# Patient Record
Sex: Male | Born: 1990 | Race: Black or African American | Hispanic: No | Marital: Single | Smoking: Never smoker
Health system: Southern US, Community
[De-identification: ages and names within clinical notes are randomized; demographics above are authoritative.]

## PROBLEM LIST (undated history)

## (undated) DIAGNOSIS — J302 Other seasonal allergic rhinitis: Secondary | ICD-10-CM

## (undated) DIAGNOSIS — J45909 Unspecified asthma, uncomplicated: Secondary | ICD-10-CM

## (undated) HISTORY — DX: Unspecified asthma, uncomplicated: J45.909

## (undated) HISTORY — DX: Other seasonal allergic rhinitis: J30.2

---

## 2014-02-02 ENCOUNTER — Encounter (HOSPITAL_COMMUNITY): Payer: Self-pay | Admitting: Emergency Medicine

## 2014-02-02 ENCOUNTER — Emergency Department (INDEPENDENT_AMBULATORY_CARE_PROVIDER_SITE_OTHER): Payer: BC Managed Care – PPO

## 2014-02-02 ENCOUNTER — Emergency Department (HOSPITAL_COMMUNITY)
Admission: EM | Admit: 2014-02-02 | Discharge: 2014-02-02 | Disposition: A | Discharge status: Home / Self Care | Payer: BC Managed Care – PPO | Source: Home / Self Care | Attending: Emergency Medicine | Admitting: Emergency Medicine

## 2014-02-02 DIAGNOSIS — S43402A Unspecified sprain of left shoulder joint, initial encounter: Secondary | ICD-10-CM

## 2014-02-02 DIAGNOSIS — S20212A Contusion of left front wall of thorax, initial encounter: Secondary | ICD-10-CM

## 2014-02-02 DIAGNOSIS — IMO0002 Reserved for concepts with insufficient information to code with codable children: Secondary | ICD-10-CM

## 2014-02-02 DIAGNOSIS — S20219A Contusion of unspecified front wall of thorax, initial encounter: Secondary | ICD-10-CM

## 2014-02-02 MED ORDER — DICLOFENAC POTASSIUM 50 MG PO TABS: 50.0000 mg | ORAL_TABLET | Freq: Two times a day (BID) | ORAL | Status: DC | PRN

## 2014-02-02 NOTE — ED Provider Notes (Signed)
CSN: [id number]     Arrival date & time 02/02/14  1623 History   First MD Initiated Contact with Patient 02/02/14 1653     Chief Complaint  Patient presents with  . Shoulder Pain  . Chest Pain   (Consider location/radiation/quality/duration/timing/severity/associated sxs/prior Treatment) HPI Comments: States he works for [employer] and for [employer]. 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 States left shoulder and left lateral chest wall have been sore with palpation and movement since injury. Has used occasional dose of ibuprofen for pain.  PCP: none Denies previous injury or surgery.   The history is provided by the patient.    History reviewed. No pertinent past medical history. History reviewed. No pertinent past surgical history. No family history on file. History  Substance Use Topics  . Smoking status: Never Smoker   . Smokeless tobacco: Not on file  . Alcohol Use: No    Review of Systems  All other systems reviewed and are negative.   Allergies  Review of patient's allergies indicates no known allergies.  Home Medications   Prior to Admission medications   Medication Sig Start Date End Date Taking? Authorizing Provider  diclofenac (CATAFLAM) 50 MG tablet Take 1 tablet (50 mg total) by mouth 2 (two) times daily as needed. For pain 02/02/14   Jess Barters Evanny Ellerbe, PA   BP 138/74  Pulse 66  Temp(Src) 98.3 F (36.8 C) (Oral)  Resp 16  SpO2 100% Physical Exam  Nursing note and vitals reviewed. Constitutional: He is oriented to person, place, and time. He appears well-developed and well-nourished.  HENT:  Head: Normocephalic and atraumatic.  Eyes: Conjunctivae are normal. No scleral icterus.  Cardiovascular: Normal rate, regular rhythm and normal heart sounds.     Pulmonary/Chest: Effort normal and breath sounds normal. No respiratory distress. He has no wheezes. Chest wall is not dull to percussion. He exhibits tenderness. He exhibits no bony tenderness, no laceration, no crepitus, no edema, no deformity, no swelling and no retraction.    Outlined area is area of tenderness.  Abdominal: Soft. Bowel sounds are normal. He exhibits no distension. There is no tenderness.  Musculoskeletal:       Left shoulder: He exhibits tenderness. He exhibits normal range of motion, no bony tenderness, no swelling, no effusion, no crepitus, no deformity, no laceration, normal pulse and normal strength.       Arms: CSM exam of LUE normal. No evidence of shoulder dislocation or biceps tendon rupture.   Neurological: He is alert and oriented to person, place, and time.  Skin: Skin is warm and dry.  Psychiatric: His behavior is normal.    ED Course  Procedures (including critical care time) Labs Review Labs Reviewed - No data to display  Imaging Review Dg Ribs Unilateral W/chest Left  02/02/2014   CLINICAL DATA:  Left-sided chest pain  EXAM: LEFT RIBS AND CHEST - 3+ VIEW  COMPARISON:  None.  FINDINGS: No fracture or other bone lesions are seen involving the ribs. There is no evidence of pneumothorax or pleural effusion. Both lungs are clear. Heart size and mediastinal contours are within normal limits.  IMPRESSION: No acute abnormality noted.   Electronically Signed   By: Alcide Clever M.D.   On: 02/02/2014 18:06  MDM   1. Contusion of ribs, left, initial encounter   2. Sprain of left shoulder joint, initial encounter   Films of left ribs and chest negative for acute injury. Will treat for chest wall contusion and left shoulder sprain with diclofenac and refer to orthopedic if left shoulder pain does not improve over next 1-2 weeks. Patient advised that chest wall contusions can take several weeks to improve.     Jess BartersJennifer Lee Nellis AFBPresson, GeorgiaPA 02/02/14 2025

## 2014-02-02 NOTE — Discharge Instructions (Signed)
Your xrays of your chest and ribs were normal. You can expect your symptoms to improve over the next 1-2 weeks. It is likely that your sprained your left shoulder when you fell. This should also improve over the next 1-2 weeks. Please use medication as prescribed and if symptoms do not improve, please contact the orthopedic specialist (Dr. Wandra Feinstein. Murphy) listed on your discharge paperwork for follow up evaluation   Chest Contusion A chest contusion is a deep bruise on your chest area. Contusions are the result of an injury that caused bleeding under the skin. A chest contusion may involve bruising of the skin, muscles, or ribs. The contusion may turn blue, purple, or yellow. Minor injuries will give you a painless contusion, but more severe contusions may stay painful and swollen for a few weeks. CAUSES  A contusion is usually caused by a blow, trauma, or direct force to an area of the body. SYMPTOMS   Swelling and redness of the injured area.  Discoloration of the injured area.  Tenderness and soreness of the injured area.  Pain. DIAGNOSIS  The diagnosis can be made by taking a history and performing a physical exam. An X-ray, CT scan, or MRI may be needed to determine if there were any associated injuries, such as broken bones (fractures) or internal injuries. TREATMENT  Often, the best treatment for a chest contusion is resting, icing, and applying cold compresses to the injured area. Deep breathing exercises may be recommended to reduce the risk of pneumonia. Over-the-counter medicines may also be recommended for pain control. HOME CARE INSTRUCTIONS   Put ice on the injured area.  Put ice in a plastic bag.  Place a towel between your skin and the bag.  Leave the ice on for 15-20 minutes, 03-04 times a day.  Only take over-the-counter or prescription medicines as directed by your caregiver. Your caregiver may recommend avoiding anti-inflammatory medicines (aspirin, ibuprofen, and naproxen)  for 48 hours because these medicines may increase bruising.  Rest the injured area.  Perform deep-breathing exercises as directed by your caregiver.  Stop smoking if you smoke.  Do not lift objects over 5 pounds (2.3 kg) for 3 days or longer if recommended by your caregiver. SEEK IMMEDIATE MEDICAL CARE IF:   You have increased bruising or swelling.  You have pain that is getting worse.  You have difficulty breathing.  You have dizziness, weakness, or fainting.  You have blood in your urine or stool.  You cough up or vomit blood.  Your swelling or pain is not relieved with medicines. MAKE SURE YOU:   Understand these instructions.  Will watch your condition.  Will get help right away if you are not doing well or get worse. Document Released: 04/03/2001 Document Revised: 04/02/2012 Document Reviewed: 12/31/2011 [facility] Patient Information 2015 [place], [place]. This information is not intended to replace advice given to you by your health care provider. Make sure you discuss any questions you have with your health care provider.  Shoulder Sprain A shoulder sprain is the result of damage to the tough, fiber-like tissues (ligaments) that help hold your shoulder in place. The ligaments may be stretched or torn. Besides the main shoulder joint (the ball and socket), there are several smaller joints that connect the bones in this area. A sprain usually involves one of those joints. Most often it is the acromioclavicular (or AC) joint. That is the joint that connects the collarbone (clavicle) and the shoulder blade (scapula) at the top point of  the shoulder blade (acromion). A shoulder sprain is a mild form of what is called a shoulder separation. Recovering from a shoulder sprain may take some time. For some, pain lingers for several months. Most people recover without long term problems. CAUSES   A shoulder sprain is usually caused by some kind of trauma. This might be:  Falling on  an outstretched arm.  Being hit hard on the shoulder.  Twisting the arm.  Shoulder sprains are more likely to occur in people who:  Play sports.  Have balance or coordination problems. SYMPTOMS   Pain when you move your shoulder.  Limited ability to move the shoulder.  Swelling and tenderness on top of the shoulder.  Redness or warmth in the shoulder.  Bruising.  A change in the shape of the shoulder. DIAGNOSIS  Your healthcare provider may:  Ask about your symptoms.  Ask about recent activity that might have caused those symptoms.  Examine your shoulder. You may be asked to do simple exercises to test movement. The other shoulder will be examined for comparison.  Order some tests that provide a look inside the body. They can show the extent of the injury. The tests could include:  X-rays.  CT (computed tomography) scan.  MRI (magnetic resonance imaging) scan. RISKS AND COMPLICATIONS  Loss of full shoulder motion.  Ongoing shoulder pain. TREATMENT  How long it takes to recover from a shoulder sprain depends on how severe it was. Treatment options may include:  Rest. You should not use the arm or shoulder until it heals.  Ice. For 2 or 3 days after the injury, put an ice pack on the shoulder up to 4 times a day. It should stay on for 15 to 20 minutes each time. Wrap the ice in a towel so it does not touch your skin.  Over-the-counter medicine to relieve pain.  A sling or brace. This will keep the arm still while the shoulder is healing.  Physical therapy or rehabilitation exercises. These will help you regain strength and motion. Ask your healthcare provider when it is OK to begin these exercises.  Surgery. The need for surgery is rare with a sprained shoulder, but some people may need surgery to keep the joint in place and reduce pain. HOME CARE INSTRUCTIONS   Ask your healthcare provider about what you should and should not do while your shoulder  heals.  Make sure you know how to apply ice to the correct area of your shoulder.  Talk with your healthcare provider about which medications should be used for pain and swelling.  If rehabilitation therapy will be needed, ask your healthcare provider to refer you to a therapist. If it is not recommended, then ask about at-home exercises. Find out when exercise should begin. SEEK MEDICAL CARE IF:  Your pain, swelling, or redness at the joint increases. SEEK IMMEDIATE MEDICAL CARE IF:   You have a fever.  You cannot move your arm or shoulder. Document Released: 11/25/2008 Document Revised: 10/01/2011 Document Reviewed: 11/25/2008 [facility] Patient Information 2015 [place], [place]. This information is not intended to replace advice given to you by your health care provider. Make sure you discuss any questions you have with your health care provider.

## 2014-02-02 NOTE — ED Notes (Addendum)
Pain in left shoulder that patient credits to an old football injury. Recently started playing football again and thinks he aggravated an old injury. Patient also reports left rib pain, tender to touch.  Patient reports feeling on a ladder.  This was approx 1 1/2 weeks ago.  Patient landed on the side of the ladder.

## 2014-02-03 NOTE — ED Provider Notes (Signed)
Medical screening examination/treatment/procedure(s) were performed by non-physician practitioner and as supervising physician I was immediately available for consultation/collaboration.  Hien Cunliffe, M.D.  Decorey Wahlert C Laelia Angelo, MD 02/03/14 1643 

## 2014-06-21 ENCOUNTER — Emergency Department (HOSPITAL_COMMUNITY): Payer: BC Managed Care – PPO

## 2014-06-21 ENCOUNTER — Encounter (HOSPITAL_COMMUNITY): Payer: Self-pay | Admitting: Emergency Medicine

## 2014-06-21 ENCOUNTER — Emergency Department (HOSPITAL_COMMUNITY)
Admission: EM | Admit: 2014-06-21 | Discharge: 2014-06-21 | Disposition: A | Discharge status: Home / Self Care | Payer: BC Managed Care – PPO | Attending: Emergency Medicine | Admitting: Emergency Medicine

## 2014-06-21 DIAGNOSIS — S39012A Strain of muscle, fascia and tendon of lower back, initial encounter: Secondary | ICD-10-CM

## 2014-06-21 DIAGNOSIS — S3992XA Unspecified injury of lower back, initial encounter: Secondary | ICD-10-CM | POA: Insufficient documentation

## 2014-06-21 DIAGNOSIS — Y998 Other external cause status: Secondary | ICD-10-CM | POA: Insufficient documentation

## 2014-06-21 DIAGNOSIS — Y92410 Unspecified street and highway as the place of occurrence of the external cause: Secondary | ICD-10-CM | POA: Insufficient documentation

## 2014-06-21 DIAGNOSIS — Y9389 Activity, other specified: Secondary | ICD-10-CM | POA: Insufficient documentation

## 2014-06-21 MED ORDER — HYDROCODONE-ACETAMINOPHEN 5-325 MG PO TABS: 1.0000 | ORAL_TABLET | Freq: Four times a day (QID) | ORAL | Status: DC | PRN

## 2014-06-21 NOTE — ED Notes (Signed)
Pt monitored by pulse ox, bp cuff, and 5-lead. 
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
## 2016-03-16 ENCOUNTER — Ambulatory Visit: Payer: Self-pay

## 2016-03-16 ENCOUNTER — Other Ambulatory Visit: Payer: Self-pay | Admitting: Occupational Medicine

## 2016-03-16 DIAGNOSIS — M79642 Pain in left hand: Secondary | ICD-10-CM

## 2016-03-16 DIAGNOSIS — M79641 Pain in right hand: Secondary | ICD-10-CM

## 2016-03-23 ENCOUNTER — Ambulatory Visit: Payer: Self-pay | Admitting: Adult Health

## 2016-03-23 DIAGNOSIS — Z0289 Encounter for other administrative examinations: Secondary | ICD-10-CM

## 2016-04-05 ENCOUNTER — Encounter: Payer: Self-pay | Admitting: Adult Health

## 2016-04-05 ENCOUNTER — Ambulatory Visit (INDEPENDENT_AMBULATORY_CARE_PROVIDER_SITE_OTHER): Payer: BLUE CROSS/BLUE SHIELD | Admitting: Adult Health

## 2016-04-05 VITALS — BP 110/70 | Temp 98.2°F | Ht 71.0 in | Wt 184.4 lb

## 2016-04-05 DIAGNOSIS — Z0000 Encounter for general adult medical examination without abnormal findings: Secondary | ICD-10-CM

## 2016-04-05 DIAGNOSIS — Z711 Person with feared health complaint in whom no diagnosis is made: Secondary | ICD-10-CM

## 2016-04-05 NOTE — Patient Instructions (Signed)
It was great meeting you today!  Please follow up one morning for your labs. I will follow up with you after I get the results back. Do not eat or drink anything besides water and black coffee the night before.   Follow up as needed  Health Maintenance, Male A healthy lifestyle and preventative care can promote health and wellness.  Maintain regular health, dental, and eye exams.  Eat a healthy diet. Foods like vegetables, fruits, whole grains, low-fat dairy products, and lean protein foods contain the nutrients you need and are low in calories. Decrease your intake of foods high in solid fats, added sugars, and salt. Get information about a proper diet from your health care provider, if necessary.  Regular physical exercise is one of the most important things you can do for your health. Most adults should get at least 150 minutes of moderate-intensity exercise (any activity that increases your heart rate and causes you to sweat) each week. In addition, most adults need muscle-strengthening exercises on 2 or more days a week.   Maintain a healthy weight. The body mass index (BMI) is a screening tool to identify possible weight problems. It provides an estimate of body fat based on height and weight. Your health care provider can find your BMI and can help you achieve or maintain a healthy weight. For males 20 years and older:  A BMI below 18.5 is considered underweight.  A BMI of 18.5 to 24.9 is normal.  A BMI of 25 to 29.9 is considered overweight.  A BMI of 30 and above is considered obese.  Maintain normal blood lipids and cholesterol by exercising and minimizing your intake of saturated fat. Eat a balanced diet with plenty of fruits and vegetables. Blood tests for lipids and cholesterol should begin at age 23 and be repeated every 5 years. If your lipid or cholesterol levels are high, you are over age 23, or you are at high risk for heart disease, you may need your cholesterol levels  checked more frequently.Ongoing high lipid and cholesterol levels should be treated with medicines if diet and exercise are not working.  If you smoke, find out from your health care provider how to quit. If you do not use tobacco, do not start.  Lung cancer screening is recommended for adults aged 55-80 years who are at high risk for developing lung cancer because of a history of smoking. A yearly low-dose CT scan of the lungs is recommended for people who have at least a 30-pack-year history of smoking and are current smokers or have quit within the past 15 years. A pack year of smoking is smoking an average of 1 pack of cigarettes a day for 1 year (for example, a 30-pack-year history of smoking could mean smoking 1 pack a day for 30 years or 2 packs a day for 15 years). Yearly screening should continue until the smoker has stopped smoking for at least 15 years. Yearly screening should be stopped for people who develop a health problem that would prevent them from having lung cancer treatment.  If you choose to drink alcohol, do not have more than 2 drinks per day. One drink is considered to be 12 oz (360 mL) of beer, 5 oz (150 mL) of wine, or 1.5 oz (45 mL) of liquor.  Avoid the use of street drugs. Do not share needles with anyone. Ask for help if you need support or instructions about stopping the use of drugs.  High blood pressure  causes heart disease and increases the risk of stroke. High blood pressure is more likely to develop in:  People who have blood pressure in the end of the normal range (100-139/85-89 mm Hg).  People who are overweight or obese.  People who are African American.  If you are 66-69 years of age, have your blood pressure checked every 3-5 years. If you are 2 years of age or older, have your blood pressure checked every year. You should have your blood pressure measured twice--once when you are at a hospital or clinic, and once when you are not at a hospital or clinic.  Record the average of the two measurements. To check your blood pressure when you are not at a hospital or clinic, you can use:  An automated blood pressure machine at a pharmacy.  A home blood pressure monitor.  If you are 64-67 years old, ask your health care provider if you should take aspirin to prevent heart disease.  Diabetes screening involves taking a blood sample to check your fasting blood sugar level. This should be done once every 3 years after age 38 if you are at a normal weight and without risk factors for diabetes. Testing should be considered at a younger age or be carried out more frequently if you are overweight and have at least 1 risk factor for diabetes.  Colorectal cancer can be detected and often prevented. Most routine colorectal cancer screening begins at the age of 44 and continues through age 67. However, your health care provider may recommend screening at an earlier age if you have risk factors for colon cancer. On a yearly basis, your health care provider may provide home test kits to check for hidden blood in the stool. A small camera at the end of a tube may be used to directly examine the colon (sigmoidoscopy or colonoscopy) to detect the earliest forms of colorectal cancer. Talk to your health care provider about this at age 46 when routine screening begins. A direct exam of the colon should be repeated every 5-10 years through age 59, unless early forms of precancerous polyps or small growths are found.  People who are at an increased risk for hepatitis B should be screened for this virus. You are considered at high risk for hepatitis B if:  You were born in a country where hepatitis B occurs often. Talk with your health care provider about which countries are considered high risk.  Your parents were born in a high-risk country and you have not received a shot to protect against hepatitis B (hepatitis B vaccine).  You have HIV or AIDS.  You use needles to  inject street drugs.  You live with, or have sex with, someone who has hepatitis B.  You are a man who has sex with other men (MSM).  You get hemodialysis treatment.  You take certain medicines for conditions like cancer, organ transplantation, and autoimmune conditions.  Hepatitis C blood testing is recommended for all people born from [birth year] through 1963 and any individual with known risk factors for hepatitis C.  Healthy men should no longer receive prostate-specific antigen (PSA) blood tests as part of routine cancer screening. Talk to your health care provider about prostate cancer screening.  Testicular cancer screening is not recommended for adolescents or adult males who have no symptoms. Screening includes self-exam, a health care provider exam, and other screening tests. Consult with your health care provider about any symptoms you have or any concerns you have  about testicular cancer.  Practice safe sex. Use condoms and avoid high-risk sexual practices to reduce the spread of sexually transmitted infections (STIs).  You should be screened for STIs, including gonorrhea and chlamydia if:  You are sexually active and are younger than 24 years.  You are older than 24 years, and your health care provider tells you that you are at risk for this type of infection.  Your sexual activity has changed since you were last screened, and you are at an increased risk for chlamydia or gonorrhea. Ask your health care provider if you are at risk.  If you are at risk of being infected with HIV, it is recommended that you take a prescription medicine daily to prevent HIV infection. This is called pre-exposure prophylaxis (PrEP). You are considered at risk if:  You are a man who has sex with other men (MSM).  You are a heterosexual man who is sexually active with multiple partners.  You take drugs by injection.  You are sexually active with a partner who has HIV.  Talk with your health care  provider about whether you are at high risk of being infected with HIV. If you choose to begin PrEP, you should first be tested for HIV. You should then be tested every 3 months for as long as you are taking PrEP.  Use sunscreen. Apply sunscreen liberally and repeatedly throughout the day. You should seek shade when your shadow is shorter than you. Protect yourself by wearing long sleeves, pants, a wide-brimmed hat, and sunglasses year round whenever you are outdoors.  Tell your health care provider of new moles or changes in moles, especially if there is a change in shape or color. Also, tell your health care provider if a mole is larger than the size of a pencil eraser.  A one-time screening for abdominal aortic aneurysm (AAA) and surgical repair of large AAAs by ultrasound is recommended for men aged 67-75 years who are current or former smokers.  Stay current with your vaccines (immunizations).   This information is not intended to replace advice given to you by your health care provider. Make sure you discuss any questions you have with your health care provider.   Document Released: 01/05/2008 Document Revised: 07/30/2014 Document Reviewed: 12/04/2010 Elsevier Interactive Patient Education [employer].

## 2016-04-05 NOTE — Progress Notes (Signed)
Patient presents to clinic today to establish care. He is a pleasant 25 year old male who  has a past medical history of Asthma and Seasonal allergies.   Acute Concerns: Complete physical.  Chronic Issues: Asthma  - Controlled with current medications  Health Maintenance: Dental -- Routine Care  Vision -- Does not do routine care Immunizations -- UTD   Diet: He works out at home and works at [employer]  Exercise: He does not follow a diet. He tries to cook most of the week. Eats out 2-3 times a week    Past Medical History:  Diagnosis Date  . Asthma   . Seasonal allergies     No past surgical history on file.  No current outpatient prescriptions on file prior to visit.   No current facility-administered medications on file prior to visit.     No Known Allergies  Family History  Problem Relation Age of Onset  . Hypertension Paternal Grandmother     Social History   Social History  . Marital status: Single    Spouse name: N/A  . Number of children: N/A  . Years of education: N/A   Occupational History  . Not on file.   Social History Main Topics  . Smoking status: Never Smoker  . Smokeless tobacco: Never Used  . Alcohol use Yes     Comment: "Socially"  . Drug use:     Frequency: 2.0 times per week    Types: Marijuana  . Sexual activity: Yes   Other Topics Concern  . Not on file   Social History Narrative   He works at [employer]   He is going to start college next semester for business   Not married   No kids      He likes to write music and poetry. He likes to be with friends    Review of Systems  Constitutional: Negative.   Respiratory: Negative.   Cardiovascular: Negative.   Gastrointestinal: Negative.   Genitourinary: Negative.   Neurological: Negative.   All other systems reviewed and are negative.   BP 110/70   Temp 98.2 F (36.8 C) (Oral)   Ht 5\' 11"  (1.803 m)   Wt 184 lb 6.4 oz (83.6 kg)   BMI 25.72 kg/m   Physical Exam    Constitutional: He is oriented to person, place, and time and well-developed, well-nourished, and in no distress. No distress.  HENT:  Head: Normocephalic and atraumatic.  Right Ear: External ear normal.  Left Ear: External ear normal.  Nose: Nose normal.  Mouth/Throat: Oropharynx is clear and moist.  Neck: Normal range of motion. Neck supple. No thyromegaly present.  Cardiovascular: Normal rate, regular rhythm, normal heart sounds and intact distal pulses.  Exam reveals no gallop and no friction rub.   No murmur heard. Pulmonary/Chest: Effort normal and breath sounds normal. No respiratory distress. He has no wheezes. He has no rales. He exhibits no tenderness.  Lymphadenopathy:    He has no cervical adenopathy.  Neurological: He is alert and oriented to person, place, and time. Gait normal. GCS score is 15.  Skin: Skin is warm and dry. No rash noted. He is not diaphoretic. No erythema. No pallor.  Psychiatric: Mood, memory, affect and judgment normal.  Nursing note and vitals reviewed.   Assessment/Plan:  1. Routine general medical examination at a health care facility - POCT Urinalysis Dipstick (Automated); Future - Basic metabolic panel; Future - CBC with Differential/Platelet; Future - Hepatic function  panel; Future - Lipid panel; Future - TSH; Future - Acute Hep Panel & Hep B Surface Ab; Future - HIV antibody; Future - HSV(herpes smplx)abs-1+2(IgG+IgM)-bld; Future - RPR; Future - Urine cytology ancillary only; Future - Follow up in one year or sooner if needed  2. Concern about STD in male without diagnosis - Acute Hep Panel & Hep B Surface Ab; Future - HIV antibody; Future - HSV(herpes smplx)abs-1+2(IgG+IgM)-bld; Future - RPR; Future - Urine cytology ancillary only; Future  Shirline Frees, NP

## 2016-04-18 ENCOUNTER — Other Ambulatory Visit (INDEPENDENT_AMBULATORY_CARE_PROVIDER_SITE_OTHER): Payer: BLUE CROSS/BLUE SHIELD

## 2016-04-18 ENCOUNTER — Other Ambulatory Visit (HOSPITAL_COMMUNITY)
Admission: RE | Admit: 2016-04-18 | Discharge: 2016-04-18 | Disposition: A | Discharge status: Home / Self Care | Payer: BLUE CROSS/BLUE SHIELD | Source: Ambulatory Visit | Attending: Adult Health | Admitting: Adult Health

## 2016-04-18 DIAGNOSIS — Z0000 Encounter for general adult medical examination without abnormal findings: Secondary | ICD-10-CM | POA: Diagnosis not present

## 2016-04-18 DIAGNOSIS — Z711 Person with feared health complaint in whom no diagnosis is made: Secondary | ICD-10-CM

## 2016-04-18 DIAGNOSIS — Z113 Encounter for screening for infections with a predominantly sexual mode of transmission: Secondary | ICD-10-CM | POA: Diagnosis present

## 2016-04-18 LAB — CBC WITH DIFFERENTIAL/PLATELET
Basophils Absolute: 0 10*3/uL (ref 0.0–0.1)
Basophils Relative: 0.5 % (ref 0.0–3.0)
EOS PCT: 3.3 % (ref 0.0–5.0)
Eosinophils Absolute: 0.1 10*3/uL (ref 0.0–0.7)
HCT: 38.6 % — ABNORMAL LOW (ref 39.0–52.0)
HEMOGLOBIN: 12.8 g/dL — AB (ref 13.0–17.0)
Lymphocytes Relative: 47.8 % — ABNORMAL HIGH (ref 12.0–46.0)
Lymphs Abs: 1.9 10*3/uL (ref 0.7–4.0)
MCHC: 33.1 g/dL (ref 30.0–36.0)
MCV: 84.6 fl (ref 78.0–100.0)
MONOS PCT: 10.7 % (ref 3.0–12.0)
Monocytes Absolute: 0.4 10*3/uL (ref 0.1–1.0)
Neutro Abs: 1.5 10*3/uL (ref 1.4–7.7)
Neutrophils Relative %: 37.7 % — ABNORMAL LOW (ref 43.0–77.0)
Platelets: 200 10*3/uL (ref 150.0–400.0)
RBC: 4.56 Mil/uL (ref 4.22–5.81)
RDW: 13.8 % (ref 11.5–15.5)
WBC: 4 10*3/uL (ref 4.0–10.5)

## 2016-04-18 LAB — HEPATIC FUNCTION PANEL
ALBUMIN: 3.6 g/dL (ref 3.5–5.2)
ALT: 6 U/L (ref 0–53)
AST: 13 U/L (ref 0–37)
Alkaline Phosphatase: 50 U/L (ref 39–117)
Bilirubin, Direct: 0.1 mg/dL (ref 0.0–0.3)
Total Bilirubin: 0.5 mg/dL (ref 0.2–1.2)
Total Protein: 5.9 g/dL — ABNORMAL LOW (ref 6.0–8.3)

## 2016-04-18 LAB — POC URINALSYSI DIPSTICK (AUTOMATED)
Bilirubin, UA: NEGATIVE
Blood, UA: NEGATIVE
Glucose, UA: NEGATIVE
KETONES UA: NEGATIVE
Leukocytes, UA: NEGATIVE
NITRITE UA: NEGATIVE
PROTEIN UA: NEGATIVE
Spec Grav, UA: 1.02
Urobilinogen, UA: 1
pH, UA: 6

## 2016-04-18 LAB — TSH: TSH: 1.39 u[IU]/mL (ref 0.35–4.50)

## 2016-04-18 LAB — LIPID PANEL
CHOLESTEROL: 134 mg/dL (ref 0–200)
HDL: 45.4 mg/dL (ref >39.00)
LDL Cholesterol: 68 mg/dL (ref 0–99)
NonHDL: 88.35
Total CHOL/HDL Ratio: 3
Triglycerides: 101 mg/dL (ref 0.0–149.0)
VLDL: 20.2 mg/dL (ref 0.0–40.0)

## 2016-04-18 LAB — BASIC METABOLIC PANEL
BUN: 12 mg/dL (ref 6–23)
CO2: 28 mEq/L (ref 19–32)
Calcium: 8.7 mg/dL (ref 8.4–10.5)
Chloride: 105 mEq/L (ref 96–112)
Creatinine, Ser: 1.12 mg/dL (ref 0.40–1.50)
GFR: 104.3 mL/min (ref >60.00)
GLUCOSE: 95 mg/dL (ref 70–99)
Potassium: 3.9 mEq/L (ref 3.5–5.1)
SODIUM: 141 meq/L (ref 135–145)

## 2016-04-19 LAB — ACUTE HEP PANEL AND HEP B SURFACE AB
HCV Ab: NEGATIVE
HEP A IGM: NONREACTIVE
Hep B C IgM: NONREACTIVE
Hep B S Ab: POSITIVE — AB
Hepatitis B Surface Ag: NEGATIVE

## 2016-04-19 LAB — URINE CYTOLOGY ANCILLARY ONLY
Chlamydia: NEGATIVE
Neisseria Gonorrhea: NEGATIVE
Trichomonas: NEGATIVE

## 2016-04-19 LAB — RPR

## 2016-04-19 LAB — HIV ANTIBODY (ROUTINE TESTING W REFLEX): HIV: NONREACTIVE

## 2016-04-23 LAB — URINE CYTOLOGY ANCILLARY ONLY: Candida vaginitis: NEGATIVE

## 2016-04-26 LAB — HSV(HERPES SMPLX)ABS-I+II(IGG+IGM)-BLD: Herpes Simplex Vrs I&II-IgM Ab (EIA): 0.16 INDEX

## 2016-05-02 ENCOUNTER — Ambulatory Visit (INDEPENDENT_AMBULATORY_CARE_PROVIDER_SITE_OTHER): Payer: BLUE CROSS/BLUE SHIELD | Admitting: Adult Health

## 2016-05-02 ENCOUNTER — Encounter: Payer: Self-pay | Admitting: Adult Health

## 2016-05-02 VITALS — BP 120/64 | Ht 71.0 in | Wt 189.4 lb

## 2016-05-02 DIAGNOSIS — T783XXA Angioneurotic edema, initial encounter: Secondary | ICD-10-CM | POA: Diagnosis not present

## 2016-05-02 MED ORDER — METHYLPREDNISOLONE ACETATE 80 MG/ML IJ SUSP
80.0000 mg | Freq: Once | INTRAMUSCULAR | Status: AC
  Administered 2016-05-02: 80 mg via INTRAMUSCULAR

## 2016-05-02 NOTE — Progress Notes (Signed)
Subjective:    Patient ID: Carl Bailey, male    DOB: 05/28/1991, 25 y.o.   MRN: [id number]  HPI  25 year old male who presents to the office with angio edema of his lower lip. He reports that he was taking a nap at work this morning and when he woke up around 5 am his bottom lip was irritated and swollen. He reports that he had [employer] before going into work but had not changed his food from his usual.   He denies any swelling of his tongue or feeling as though it is hard to breath  Has not changed any soaps, perfumes or lotions  Review of Systems  Constitutional: Negative.   HENT: Positive for facial swelling. Negative for mouth sores and sore throat.   Respiratory: Negative.   Cardiovascular: Negative.   All other systems reviewed and are negative.  Past Medical History:  Diagnosis Date  . Asthma   . Seasonal allergies     Social History   Social History  . Marital status: Single    Spouse name: N/A  . Number of children: N/A  . Years of education: N/A   Occupational History  . Not on file.   Social History Main Topics  . Smoking status: Never Smoker  . Smokeless tobacco: Never Used  . Alcohol use Yes     Comment: "Socially"  . Drug use:     Frequency: 2.0 times per week    Types: Marijuana  . Sexual activity: Yes   Other Topics Concern  . Not on file   Social History Narrative   He works at [employer]   He is going to start college next semester for business   Not married   No kids      He likes to write music and poetry. He likes to be with friends    No past surgical history on file.  Family History  Problem Relation Age of Onset  . Hypertension Paternal Grandmother     No Known Allergies  No current outpatient prescriptions on file prior to visit.   No current facility-administered medications on file prior to visit.     BP 120/64   Ht 5\' 11"  (1.803 m)   Wt 189 lb 6.4 oz (85.9 kg)   BMI 26.42 kg/m       Objective:   Physical Exam  Constitutional: He is oriented to person, place, and time. He appears well-developed and well-nourished. No distress.  Cardiovascular: Normal rate, regular rhythm, normal heart sounds and intact distal pulses.  Exam reveals no gallop.   No murmur heard. Pulmonary/Chest: Effort normal and breath sounds normal. No respiratory distress. He has no wheezes. He has no rales. He exhibits no tenderness.  Neurological: He is alert and oriented to person, place, and time.  Skin: Skin is warm and dry. He is not diaphoretic.  Angioedema of lower lip. He does have what appears as an insect bite on the right corner of his lower lip. No blistering noted. No swelling to tongue or upper lip   Psychiatric: He has a normal mood and affect. His behavior is normal. Judgment and thought content normal.  Nursing note and vitals reviewed.     Assessment & Plan:  1. Angioedema, initial encounter - Not convinced that this is from a food allergy. Possibly insect bite - methylPREDNISolone acetate (DEPO-MEDROL) injection 80 mg; Inject 1 mL (80 mg total) into the muscle once. - Take 50 mg of  Benadryl when you get home - go to the ER if you feel as though your throat is closing or it is hard to breath - Follow up as needed  Shirline Frees, NP

## 2016-05-02 NOTE — Patient Instructions (Addendum)
It was great seeing you today!  I have given you a shot of prednisone. This will help with the swelling.   When you get home this morning take two tabs of benadryl.   If you feel like it is hard to breath or your tongue starts to swell, then please follow up at the ER  Angioedema Angioedema is a sudden swelling of tissues, often of the skin. It can occur on the face or genitals or in the abdomen or other body parts. The swelling usually develops over a short period and gets better in 24 to 48 hours. It often begins during the night and is found when the person wakes up. The person may also get red, itchy patches of skin (hives). Angioedema can be dangerous if it involves swelling of the air passages.  Depending on the cause, episodes of angioedema may only happen once, come back in unpredictable patterns, or repeat for several years and then gradually fade away.  CAUSES  Angioedema can be caused by an allergic reaction to various triggers. It can also result from nonallergic causes, including reactions to drugs, immune system disorders, viral infections, or an abnormal gene that is passed to you from your parents (hereditary). For some people with angioedema, the cause is unknown.  Some things that can trigger angioedema include:   Foods.   Medicines, such as ACE inhibitors, ARBs, nonsteroidal anti-inflammatory agents, or estrogen.   Latex.   Animal saliva.   Insect stings.   Dyes used in X-rays.   Mild injury.   Dental work.  Surgery.  Stress.   Sudden changes in temperature.   Exercise. SIGNS AND SYMPTOMS   Swelling of the skin.  Hives. If these are present, there is also intense itching.  Redness in the affected area.   Pain in the affected area.  Swollen lips or tongue.  Breathing problems. This may happen if the air passages swell.  Wheezing. If internal organs are involved, there may be:   Nausea.   Abdominal pain.   Vomiting.    Difficulty swallowing.   Difficulty passing urine. DIAGNOSIS   Your health care provider will examine the affected area and take a medical and family history.  Various tests may be done to help determine the cause. Tests may include:  Allergy skin tests to see if the problem is an allergic reaction.   Blood tests to check for hereditary angioedema.   Tests to check for underlying diseases that could cause the condition.   A review of your medicines, including over-the-counter medicines, may be done. TREATMENT  Treatment will depend on the cause of the angioedema. Possible treatments include:   Removal of anything that triggered the condition (such as stopping certain medicines).   Medicines to treat symptoms or prevent attacks. Medicines given may include:   Antihistamines.   Epinephrine injection.   Steroids.   Hospitalization may be required for severe attacks. If the air passages are affected, it can be an emergency. Tubes may need to be placed to keep the airway open. HOME CARE INSTRUCTIONS   Take all medicines as directed by your health care provider.  If you were given medicines for emergency allergy treatment, always carry them with you.  Wear a medical bracelet as directed by your health care provider.   Avoid known triggers. SEEK MEDICAL CARE IF:   You have repeat attacks of angioedema.   Your attacks are more frequent or more severe despite preventive measures.   You have hereditary  angioedema and are considering having children. It is important to discuss with your health care provider the risks of passing the condition on to your children. SEEK IMMEDIATE MEDICAL CARE IF:   You have severe swelling of the mouth, tongue, or lips.  You have difficulty breathing.   You have difficulty swallowing.   You faint. MAKE SURE YOU:  Understand these instructions.  Will watch your condition.  Will get help right away if you are not doing  well or get worse.   This information is not intended to replace advice given to you by your health care provider. Make sure you discuss any questions you have with your health care provider.   Document Released: 09/17/2001 Document Revised: 07/30/2014 Document Reviewed: 03/02/2013 Elsevier Interactive Patient Education [employer] Elsevier Inc.

## 2016-05-11 ENCOUNTER — Encounter: Payer: Self-pay | Admitting: Adult Health

## 2016-05-11 ENCOUNTER — Ambulatory Visit (INDEPENDENT_AMBULATORY_CARE_PROVIDER_SITE_OTHER): Payer: BLUE CROSS/BLUE SHIELD | Admitting: Adult Health

## 2016-05-11 VITALS — BP 120/64 | Temp 98.5°F | Ht 71.0 in | Wt 182.7 lb

## 2016-05-11 DIAGNOSIS — R05 Cough: Secondary | ICD-10-CM

## 2016-05-11 DIAGNOSIS — R059 Cough, unspecified: Secondary | ICD-10-CM

## 2016-05-11 MED ORDER — PREDNISONE 10 MG PO TABS: ORAL_TABLET | ORAL | 0 refills | Status: DC

## 2016-05-11 NOTE — Progress Notes (Signed)
Subjective:    Patient ID: Carl Bailey, male    DOB: November 08, 1990, 25 y.o.   MRN: [id number]  Cough  This is a new problem. The current episode started in the past 7 days (3 days). The problem has been gradually worsening. The cough is productive of sputum. Associated symptoms include a fever, headaches (resolved) and shortness of breath. Pertinent negatives include no rhinorrhea or sore throat. He has tried nothing for the symptoms. His past medical history is significant for asthma.      Review of Systems  Constitutional: Positive for fever.  HENT: Positive for congestion. Negative for ear discharge, rhinorrhea, sinus pressure, sneezing and sore throat.   Respiratory: Positive for cough, chest tightness and shortness of breath.   Cardiovascular: Negative.   Neurological: Positive for headaches (resolved).   Past Medical History:  Diagnosis Date  . Asthma   . Seasonal allergies     Social History   Social History  . Marital status: Single    Spouse name: N/A  . Number of children: N/A  . Years of education: N/A   Occupational History  . Not on file.   Social History Main Topics  . Smoking status: Never Smoker  . Smokeless tobacco: Never Used  . Alcohol use Yes     Comment: "Socially"  . Drug use:     Frequency: 2.0 times per week    Types: Marijuana  . Sexual activity: Yes   Other Topics Concern  . Not on file   Social History Narrative   He works at [employer]   He is going to start college next semester for business   Not married   No kids      He likes to write music and poetry. He likes to be with friends    No past surgical history on file.  Family History  Problem Relation Age of Onset  . Hypertension Paternal Grandmother     No Known Allergies  No current outpatient prescriptions on file prior to visit.   No current facility-administered medications on file prior to visit.     BP 120/64   Temp 98.5 F (36.9 C) (Oral)   Ht 5'  11" (1.803 m)   Wt 182 lb 11.2 oz (82.9 kg)   BMI 25.48 kg/m       Objective:   Physical Exam  Constitutional: He is oriented to person, place, and time. He appears well-developed and well-nourished. No distress.  HENT:  Head: Normocephalic and atraumatic.  Right Ear: External ear normal.  Left Ear: External ear normal.  Nose: Nose normal.  Mouth/Throat: Oropharynx is clear and moist. No oropharyngeal exudate.  Eyes: Conjunctivae and EOM are normal. Pupils are equal, round, and reactive to light. Right eye exhibits no discharge. Left eye exhibits no discharge. No scleral icterus.  Neck: Normal range of motion. Neck supple. No thyromegaly present.  Cardiovascular: Normal rate, regular rhythm, normal heart sounds and intact distal pulses.  Exam reveals no gallop and no friction rub.   No murmur heard. Dry hacking cough   Pulmonary/Chest: Effort normal. No respiratory distress. Wheezes: trace upper lobes. He has no rales. He exhibits no tenderness.  Lymphadenopathy:    He has no cervical adenopathy.  Neurological: He is alert and oriented to person, place, and time. He has normal reflexes.  Skin: Skin is warm and dry. No rash noted. He is not diaphoretic. No erythema. No pallor.  Psychiatric: He has a normal mood and affect. His  behavior is normal. Judgment and thought content normal.  Nursing note and vitals reviewed.     Assessment & Plan:  1. Cough - Not concerned about a bacterial infection at this time - predniSONE (DELTASONE) 10 MG tablet; 40 mg x 3 days, 20 mg x 3 days, 10 mg x 3 days  Dispense: 21 tablet; Refill: 0 - Mucinex - Follow up if no improvement  Shirline Freesory Arneda [place], NP

## 2016-05-11 NOTE — Patient Instructions (Signed)
It was great seeing you again.   It appears as though you have a viral illness. I have sent in prednisone to help with the congestion and cough. Take this as directed.   Also start using mucinex. Make sure you drink plenty of water with this.   Get plenty of rest

## 2016-05-16 ENCOUNTER — Telehealth: Payer: Self-pay | Admitting: Adult Health

## 2016-05-16 ENCOUNTER — Other Ambulatory Visit: Payer: Self-pay | Admitting: Adult Health

## 2016-05-16 MED ORDER — DOXYCYCLINE HYCLATE 100 MG PO CAPS: 100.0000 mg | ORAL_CAPSULE | Freq: Two times a day (BID) | ORAL | 0 refills | Status: DC

## 2016-05-16 NOTE — Telephone Encounter (Signed)
Did the prednisone help? Is he running a fever?

## 2016-05-16 NOTE — Telephone Encounter (Signed)
Patient notified and verbalized understanding. 

## 2016-05-16 NOTE — Telephone Encounter (Signed)
Patient states he is finishing up the prednisone, and feels like it not not getting all of the congestion out. He denies any fevers. He states that he has to walk about half a mile into work, and by the time he get's there, he feels as if he has been running and is out of breath. He states that this causes him to cough a lot and feel very weak & exhausted.

## 2016-05-16 NOTE — Telephone Encounter (Signed)
Please advise 

## 2016-05-16 NOTE — Telephone Encounter (Signed)
° °  Pt call to say Carl Bailey told him to call and he would call him back if he started to wheeze. Pt said he is noticing some changing in his breathing

## 2016-05-16 NOTE — Telephone Encounter (Signed)
I have sent in a prescription for Doxycycline. This is an antibiotic. Also take Mucinex to help with the cough and congestion

## 2018-04-22 IMAGING — CR DG HAND COMPLETE 3+V*R*
3 series · 3 of 3 positions shown · non-contrast
Comparison: None.

CLINICAL DATA: Source in the palm and digits of both hands. Feels
like something is in his hands.

EXAM:
RIGHT HAND - COMPLETE 3+ VIEW

[view not recorded (1 of 3)]
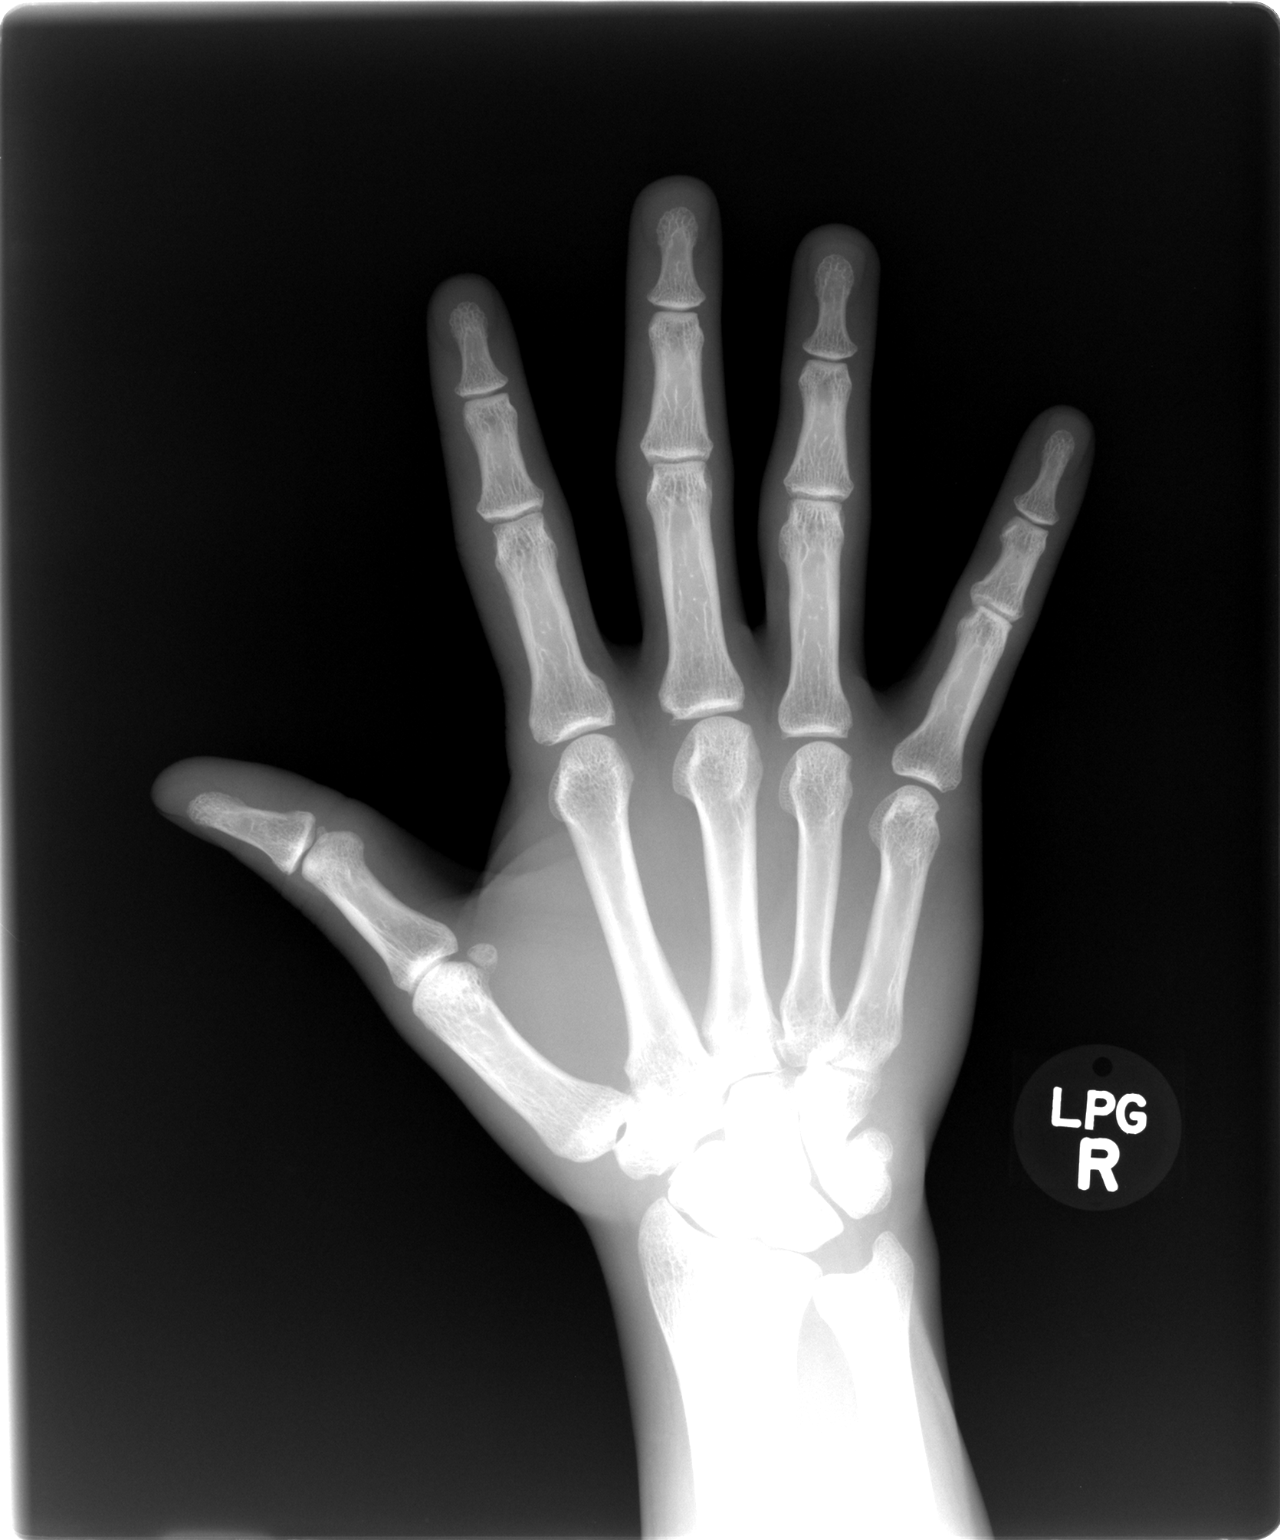

[view not recorded (2 of 3)]
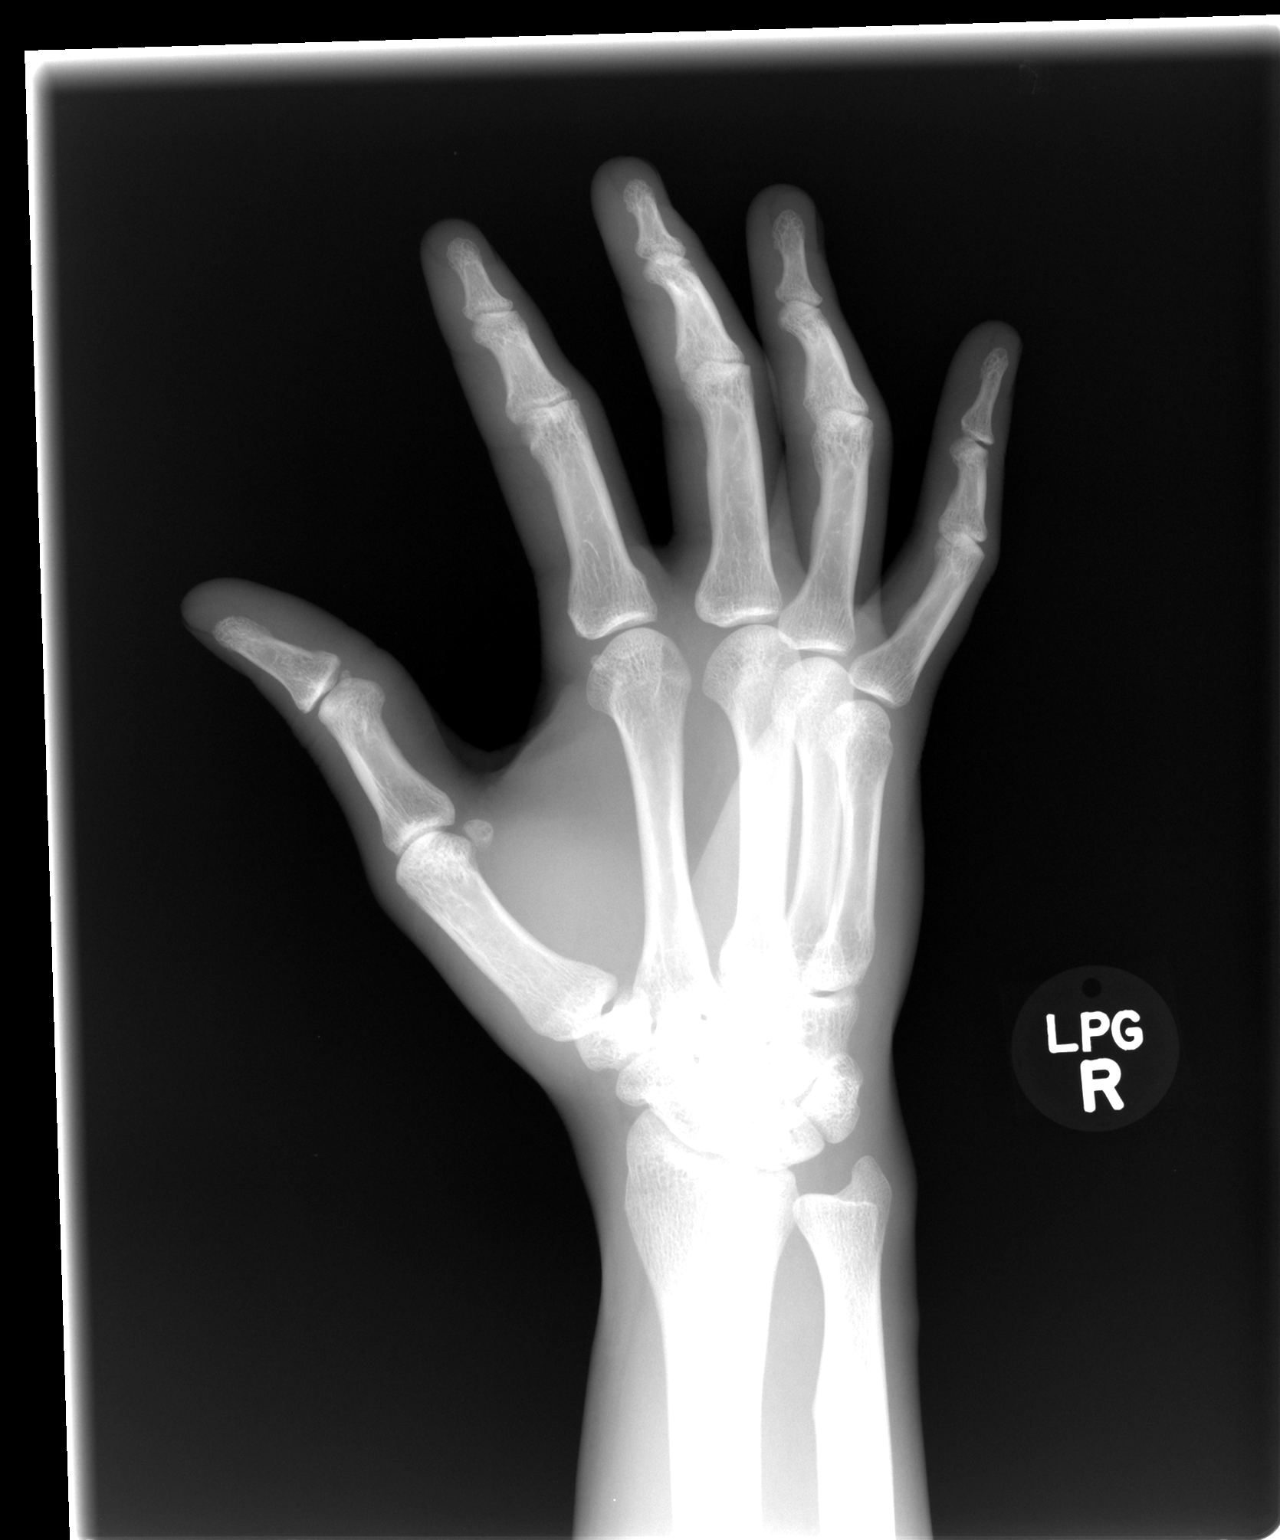

[view not recorded (3 of 3)]
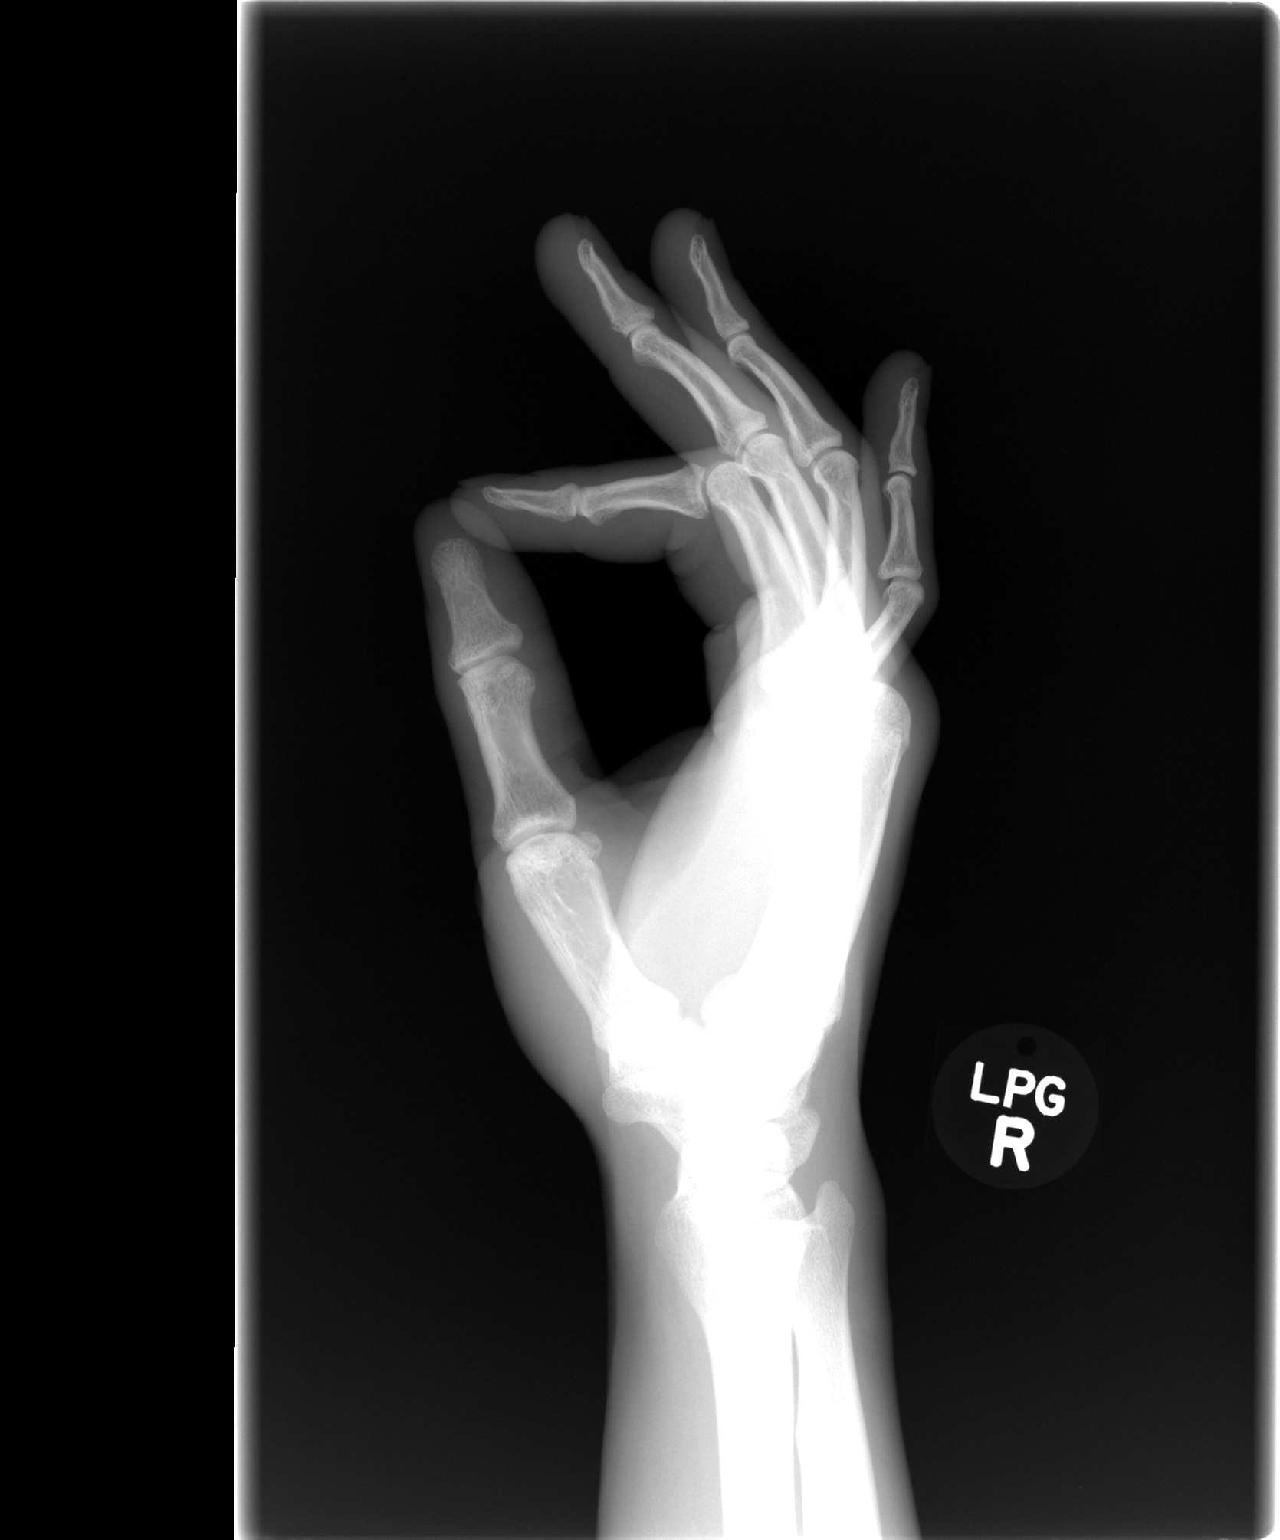

[3 of 3 positions shown; findings below may reference images not displayed]

FINDINGS: There is no evidence of fracture or dislocation. There is no
evidence of arthropathy or other focal bone abnormality. Soft
tissues are unremarkable.
IMPRESSION: Negative.

## 2018-04-22 IMAGING — CR DG HAND COMPLETE 3+V*L*
3 series · 3 of 3 positions shown · non-contrast
Comparison: None.

CLINICAL DATA: Left hand pain after carrying packages

EXAM:
LEFT HAND - COMPLETE 3+ VIEW

[view not recorded (1 of 3)]
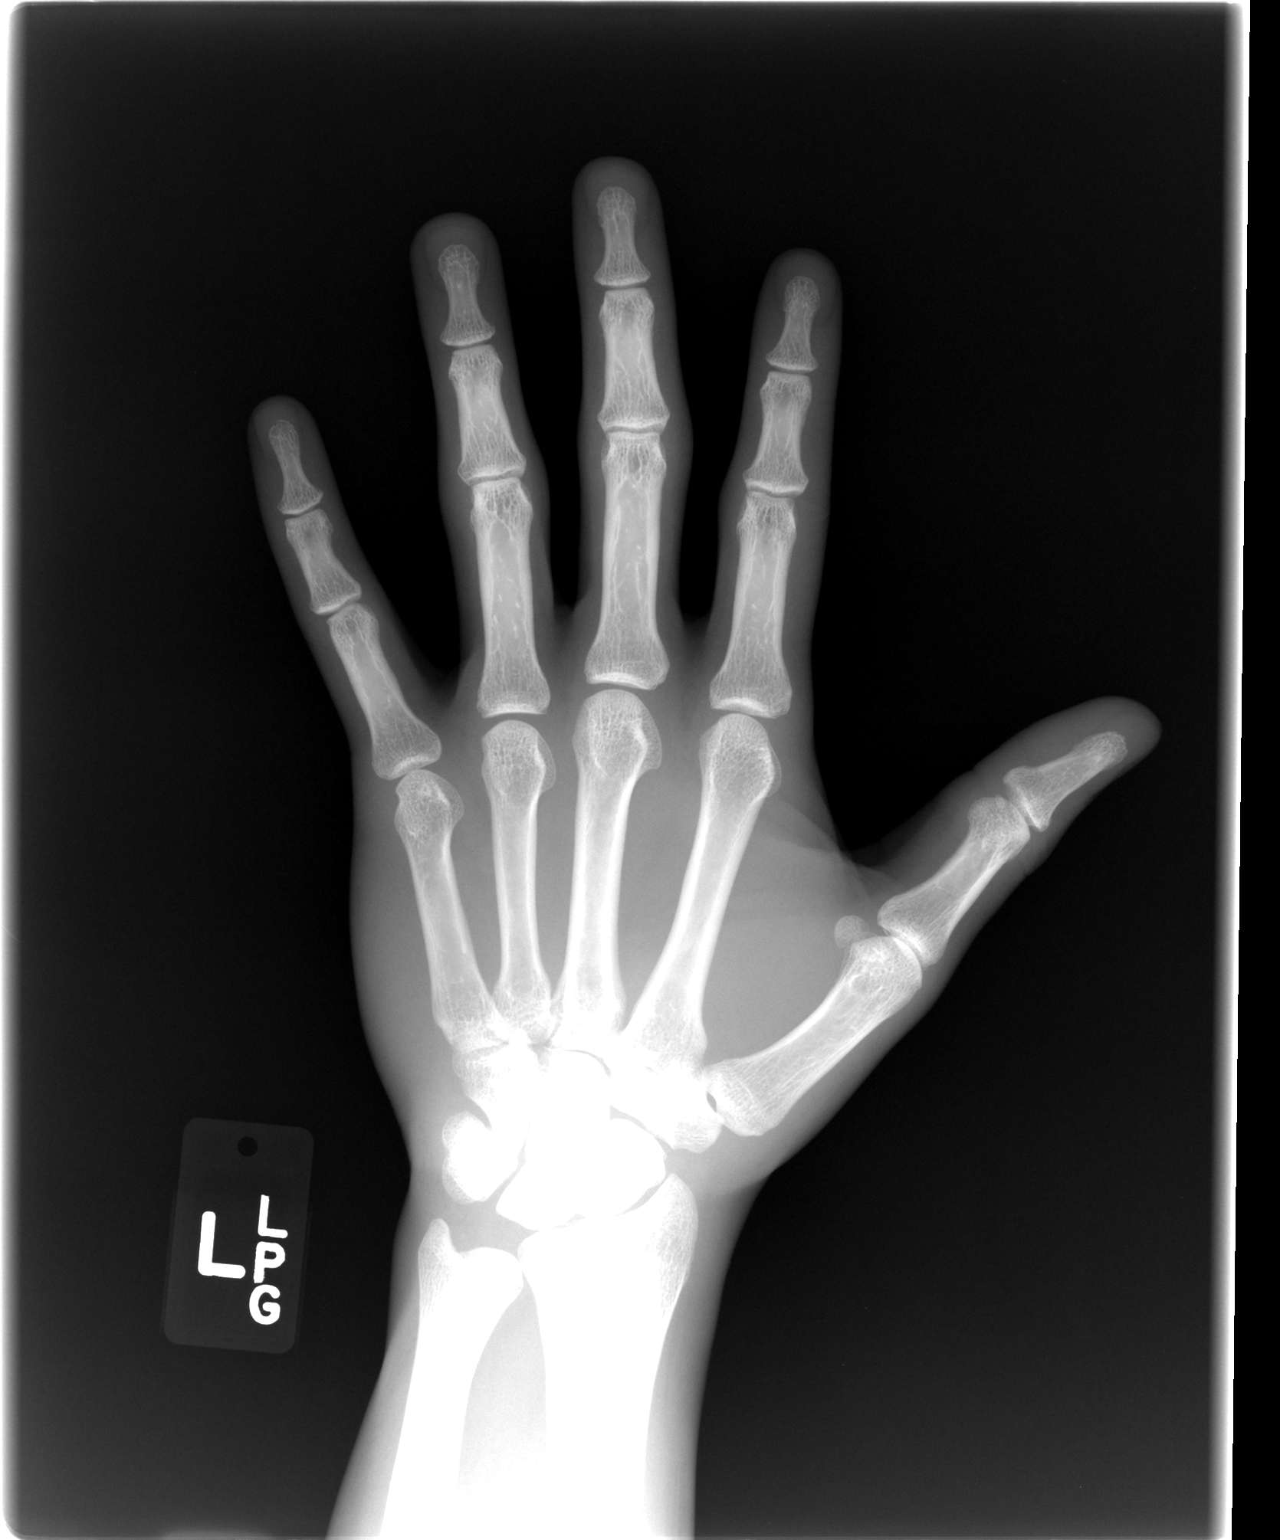

[view not recorded (2 of 3)]
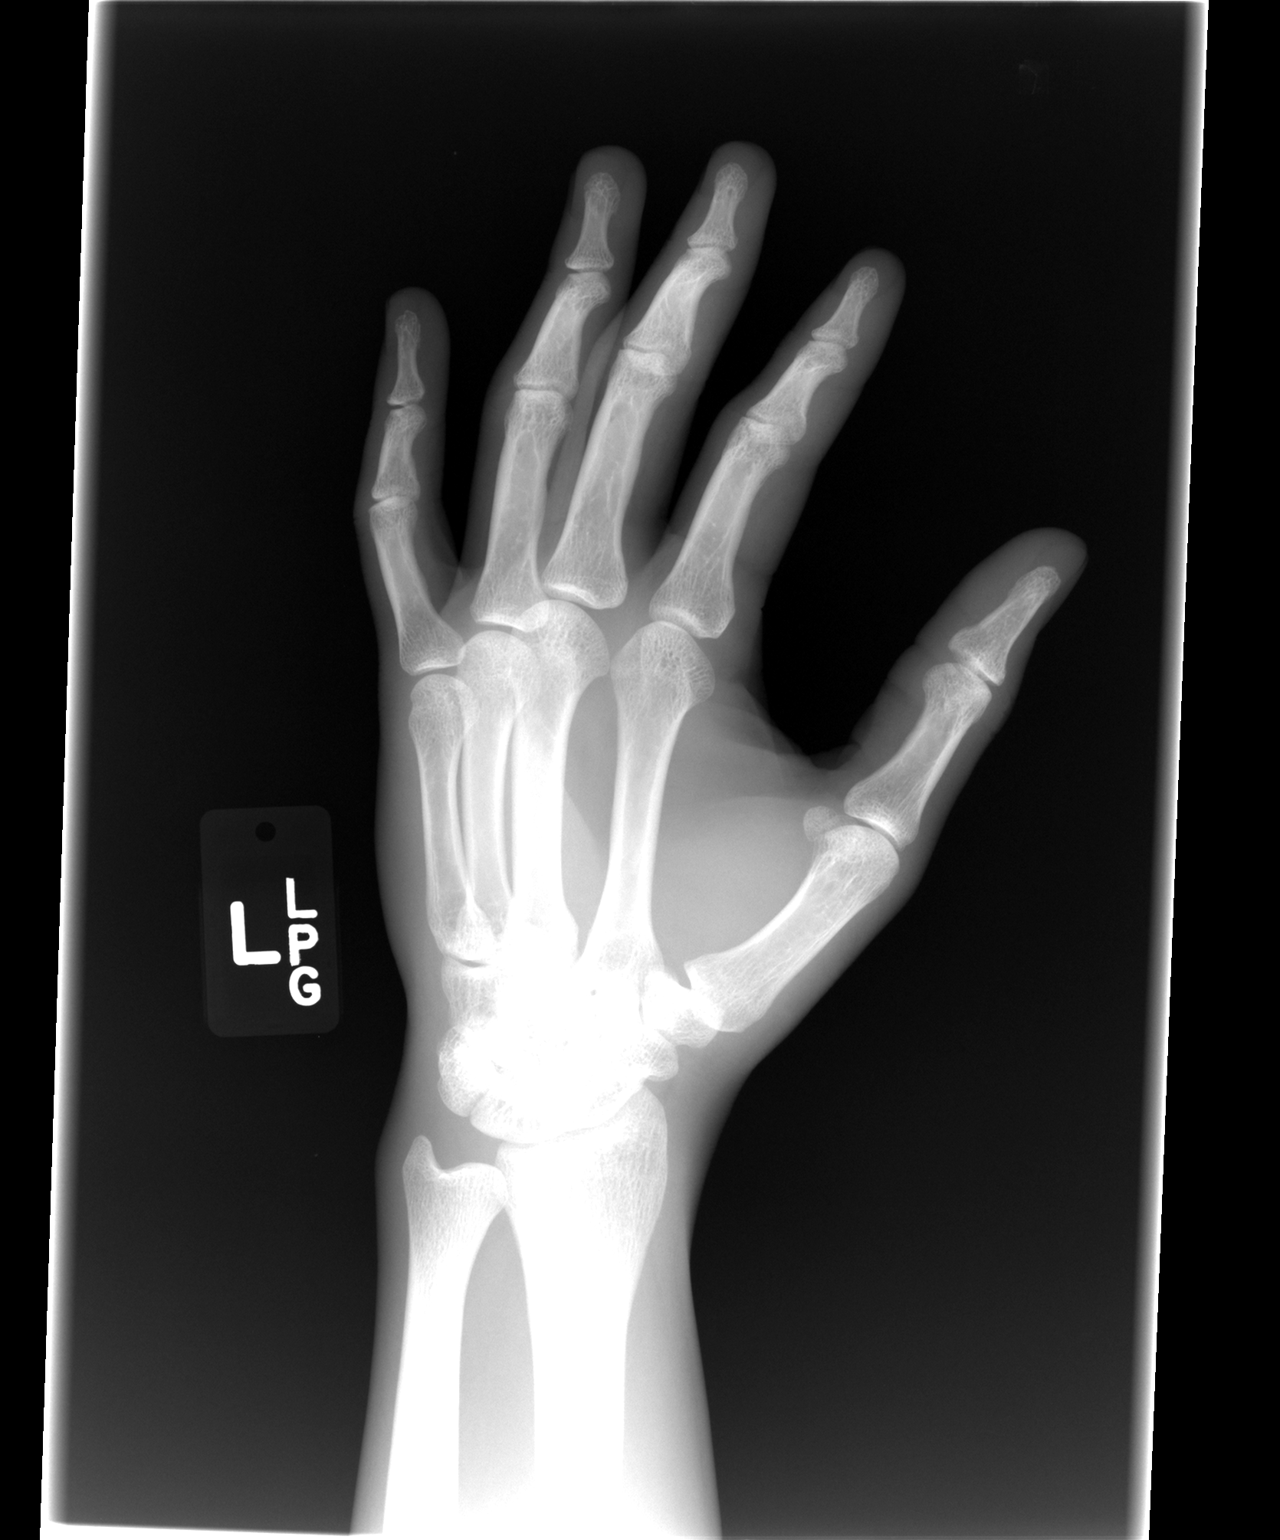

[view not recorded (3 of 3)]
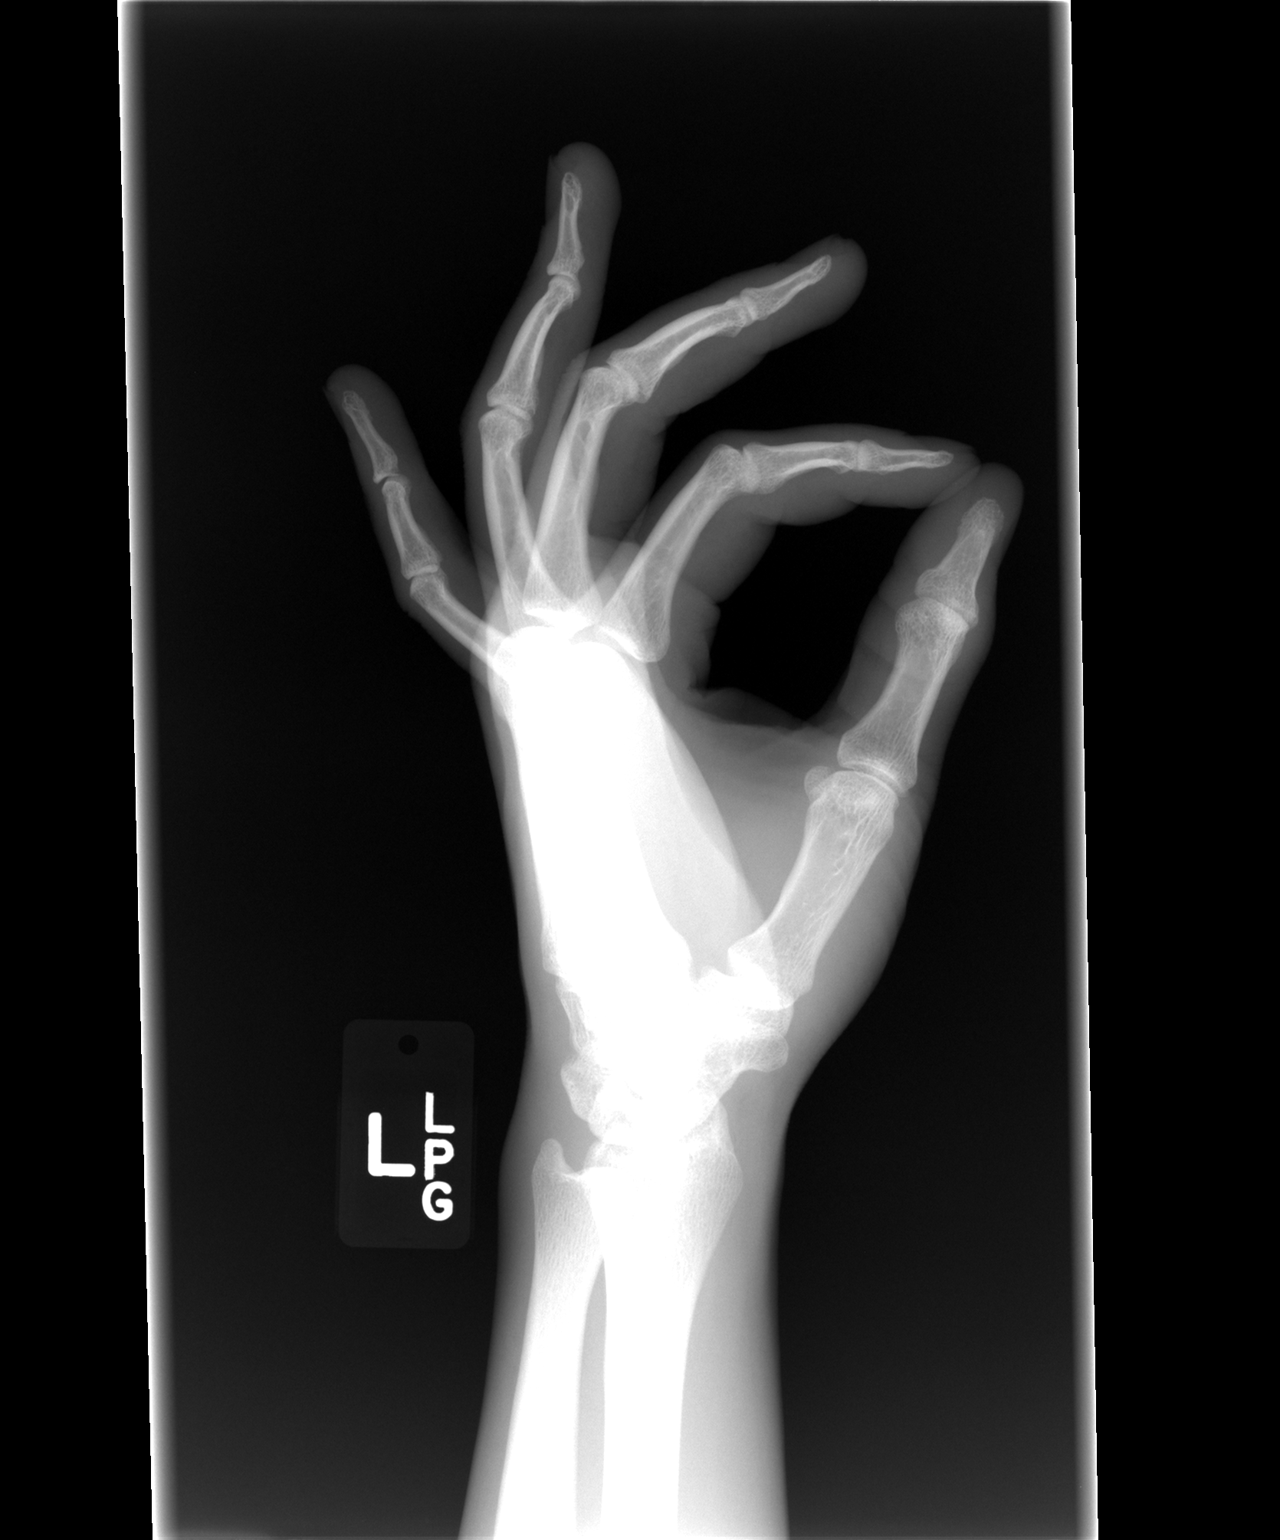

[3 of 3 positions shown; findings below may reference images not displayed]

FINDINGS: Three views of the left hand submitted. No acute fracture or
subluxation. Mild negative ulnar variance. No radiopaque foreign
body.
IMPRESSION: Negative.

## 2019-06-07 ENCOUNTER — Other Ambulatory Visit: Payer: Self-pay

## 2019-06-07 ENCOUNTER — Emergency Department (HOSPITAL_COMMUNITY): Payer: BC Managed Care – PPO

## 2019-06-07 ENCOUNTER — Encounter (HOSPITAL_COMMUNITY): Payer: Self-pay

## 2019-06-07 ENCOUNTER — Emergency Department (HOSPITAL_COMMUNITY)
Admission: EM | Admit: 2019-06-07 | Discharge: 2019-06-07 | Disposition: A | Discharge status: Home / Self Care | Payer: BC Managed Care – PPO | Attending: Emergency Medicine | Admitting: Emergency Medicine

## 2019-06-07 DIAGNOSIS — Z79899 Other long term (current) drug therapy: Secondary | ICD-10-CM | POA: Insufficient documentation

## 2019-06-07 DIAGNOSIS — J45909 Unspecified asthma, uncomplicated: Secondary | ICD-10-CM | POA: Insufficient documentation

## 2019-06-07 DIAGNOSIS — R1032 Left lower quadrant pain: Secondary | ICD-10-CM | POA: Insufficient documentation

## 2019-06-07 DIAGNOSIS — R197 Diarrhea, unspecified: Secondary | ICD-10-CM | POA: Insufficient documentation

## 2019-06-07 DIAGNOSIS — R7989 Other specified abnormal findings of blood chemistry: Secondary | ICD-10-CM | POA: Insufficient documentation

## 2019-06-07 LAB — URINALYSIS, ROUTINE W REFLEX MICROSCOPIC
Bilirubin Urine: NEGATIVE
Glucose, UA: NEGATIVE mg/dL
Hgb urine dipstick: NEGATIVE
Ketones, ur: NEGATIVE mg/dL
Leukocytes,Ua: NEGATIVE
Nitrite: NEGATIVE
Protein, ur: NEGATIVE mg/dL
Specific Gravity, Urine: 1.006 (ref 1.005–1.030)
pH: 6 (ref 5.0–8.0)

## 2019-06-07 LAB — COMPREHENSIVE METABOLIC PANEL
ALT: 155 U/L — ABNORMAL HIGH (ref 0–44)
AST: 408 U/L — ABNORMAL HIGH (ref 15–41)
Albumin: 3.7 g/dL (ref 3.5–5.0)
Alkaline Phosphatase: 64 U/L (ref 38–126)
Anion gap: 7 (ref 5–15)
BUN: 10 mg/dL (ref 6–20)
CO2: 23 mmol/L (ref 22–32)
Calcium: 8.5 mg/dL — ABNORMAL LOW (ref 8.9–10.3)
Chloride: 105 mmol/L (ref 98–111)
Creatinine, Ser: 0.99 mg/dL (ref 0.61–1.24)
GFR calc Af Amer: >60 mL/min (ref >60)
GFR calc non Af Amer: >60 mL/min (ref >60)
Glucose, Bld: 123 mg/dL — ABNORMAL HIGH (ref 70–99)
Potassium: 3.7 mmol/L (ref 3.5–5.1)
Sodium: 135 mmol/L (ref 135–145)
Total Bilirubin: 1.2 mg/dL (ref 0.3–1.2)
Total Protein: 6.3 g/dL — ABNORMAL LOW (ref 6.5–8.1)

## 2019-06-07 LAB — CBC WITH DIFFERENTIAL/PLATELET
Abs Immature Granulocytes: 0.03 10*3/uL (ref 0.00–0.07)
Basophils Absolute: 0 10*3/uL (ref 0.0–0.1)
Basophils Relative: 0 %
Eosinophils Absolute: 0 10*3/uL (ref 0.0–0.5)
Eosinophils Relative: 0 %
HCT: 48.2 % (ref 39.0–52.0)
Hemoglobin: 15.6 g/dL (ref 13.0–17.0)
Immature Granulocytes: 0 %
Lymphocytes Relative: 9 %
Lymphs Abs: 0.9 10*3/uL (ref 0.7–4.0)
MCH: 28.1 pg (ref 26.0–34.0)
MCHC: 32.4 g/dL (ref 30.0–36.0)
MCV: 86.7 fL (ref 80.0–100.0)
Monocytes Absolute: 0.8 10*3/uL (ref 0.1–1.0)
Monocytes Relative: 8 %
Neutro Abs: 8.3 10*3/uL — ABNORMAL HIGH (ref 1.7–7.7)
Neutrophils Relative %: 83 %
Platelets: 186 10*3/uL (ref 150–400)
RBC: 5.56 MIL/uL (ref 4.22–5.81)
RDW: 12.2 % (ref 11.5–15.5)
WBC: 10 10*3/uL (ref 4.0–10.5)
nRBC: 0 % (ref 0.0–0.2)

## 2019-06-07 LAB — ACETAMINOPHEN LEVEL: Acetaminophen (Tylenol), Serum: <10 ug/mL — ABNORMAL LOW (ref 10–30)

## 2019-06-07 LAB — LIPASE, BLOOD: Lipase: 20 U/L (ref 11–51)

## 2019-06-07 MED ORDER — SODIUM CHLORIDE 0.9 % IV BOLUS
1000.0000 mL | Freq: Once | INTRAVENOUS | Status: AC
  Administered 2019-06-07: 1000 mL via INTRAVENOUS

## 2019-06-07 MED ORDER — OXYCODONE HCL 5 MG PO TABS: 5.0000 mg | ORAL_TABLET | ORAL | 0 refills | Status: AC | PRN

## 2019-06-07 MED ORDER — MORPHINE SULFATE (PF) 4 MG/ML IV SOLN
4.0000 mg | Freq: Once | INTRAVENOUS | Status: AC
  Administered 2019-06-07: 15:00:00 4 mg via INTRAVENOUS
  Filled 2019-06-07: qty 1

## 2019-06-07 MED ORDER — SODIUM CHLORIDE (PF) 0.9 % IJ SOLN
INTRAMUSCULAR | Status: AC
  Filled 2019-06-07: qty 50

## 2019-06-07 MED ORDER — IOHEXOL 300 MG/ML  SOLN
100.0000 mL | Freq: Once | INTRAMUSCULAR | Status: AC | PRN
  Administered 2019-06-07: 100 mL via INTRAVENOUS

## 2019-06-07 NOTE — ED Notes (Signed)
Patient aware that we need urine 

## 2019-06-07 NOTE — ED Triage Notes (Signed)
Pt BIB EMS from home. Pt reports abd pain that started at 10 am this morning. Pt reports loose stool this morning. Constant LLQ abd pain since then.   150/98 HR 70 RR 18 100% RA CBG 184

## 2019-06-07 NOTE — ED Provider Notes (Signed)
[facility] DEPT Provider Note   CSN: [id number] Arrival date & time: 06/07/19  1341     History   Chief Complaint Chief Complaint  Patient presents with   Abdominal Pain    HPI Carl Bailey is a 28 y.o. male who presents with abdominal pain.  Patient states he woke up around 10 AM this morning with some left lower abdominal discomfort which was mild in nature.  He had a bowel movement which was watery/runny. He went to donate plasma and when he came back the pain was becoming more severe in nature.  It is sharp and stabbing.  It sometimes radiates across his abdomen.  He drank some Powerade which temporarily helped his symptoms.  He tried to have another bowel movement but was not able unable to and started to have severe pain.  Ports associated chills and sweats.  He had some shortness of breath when the pain was severe.  Has never had pain like this before.  He denies fever, chest pain, current shortness of breath, cough, nausea, vomiting, blood in the stool, urinary symptoms or testicular pain.  He denies prior abdominal surgeries. He denies any significant ETOH or Tylenol use. He denies IVDU   HPI  Past Medical History:  Diagnosis Date   Asthma    Seasonal allergies     There are no active problems to display for this patient.   History reviewed. No pertinent surgical history.      Home Medications    Prior to Admission medications   Medication Sig Start Date End Date Taking? Authorizing Provider  doxycycline (VIBRAMYCIN) 100 MG capsule Take 1 capsule (100 mg total) by mouth 2 (two) times daily. 05/16/16   Nafziger, Tommi Rumps, NP  predniSONE (DELTASONE) 10 MG tablet 40 mg x 3 days, 20 mg x 3 days, 10 mg x 3 days 05/11/16   Dorothyann Peng, NP    Family History Family History  Problem Relation Age of Onset   Hypertension Paternal Grandmother     Social History Social History   Tobacco Use   Smoking status: Never Smoker    Smokeless tobacco: Never Used  Substance Use Topics   Alcohol use: Yes    Comment: "Socially"   Drug use: Yes    Frequency: 2.0 times per week    Types: Marijuana     Allergies   Patient has no known allergies.   Review of Systems Review of Systems  Constitutional: Positive for chills and diaphoresis. Negative for fever.  Respiratory: Positive for shortness of breath (with pain). Negative for cough.   Cardiovascular: Negative for chest pain.  Gastrointestinal: Positive for abdominal pain and diarrhea. Negative for blood in stool, constipation, nausea and vomiting.  Genitourinary: Negative for difficulty urinating, dysuria, penile pain and testicular pain.  All other systems reviewed and are negative.    Physical Exam Updated Vital Signs BP 136/79 (BP Location: Left Arm)    Pulse 78    Temp 97.6 F (36.4 C) (Oral)    Resp 18    SpO2 100%   Physical Exam Vitals signs and nursing note reviewed.  Constitutional:      General: He is not in acute distress.    Appearance: Normal appearance. He is well-developed. He is not ill-appearing.  HENT:     Head: Normocephalic and atraumatic.  Eyes:     General: No scleral icterus.       Right eye: No discharge.  Left eye: No discharge.     Conjunctiva/sclera: Conjunctivae normal.     Pupils: Pupils are equal, round, and reactive to light.  Neck:     Musculoskeletal: Normal range of motion.  Cardiovascular:     Rate and Rhythm: Normal rate and regular rhythm.  Pulmonary:     Effort: Pulmonary effort is normal. No respiratory distress.     Breath sounds: Normal breath sounds.  Abdominal:     General: There is no distension.     Palpations: Abdomen is soft. There is no mass.     Tenderness: There is abdominal tenderness (periumbilical and LLQ). There is no right CVA tenderness, left CVA tenderness, guarding or rebound.     Hernia: No hernia is present.  Genitourinary:    Comments: No inguinal lymphadenopathy or inguinal  hernia noted. Normal circumcised penis free of lesions or rash. Testicles are nontender with normal lie. Normal scrotal appearance. No obvious discharge noted. Chaperone present during exam.   Skin:    General: Skin is warm and dry.  Neurological:     Mental Status: He is alert and oriented to person, place, and time.  Psychiatric:        Behavior: Behavior normal.      ED Treatments / Results  Labs (all labs ordered are listed, but only abnormal results are displayed) Labs Reviewed  COMPREHENSIVE METABOLIC PANEL - Abnormal; Notable for the following components:      Result Value   Glucose, Bld 123 (*)    Calcium 8.5 (*)    Total Protein 6.3 (*)    AST 408 (*)    ALT 155 (*)    All other components within normal limits  CBC WITH DIFFERENTIAL/PLATELET - Abnormal; Notable for the following components:   Neutro Abs 8.3 (*)    All other components within normal limits  ACETAMINOPHEN LEVEL - Abnormal; Notable for the following components:   Acetaminophen (Tylenol), Serum <10 (*)    All other components within normal limits  LIPASE, BLOOD  URINALYSIS, ROUTINE W REFLEX MICROSCOPIC  HEPATITIS PANEL, ACUTE    EKG None  Radiology Ct Abdomen Pelvis W Contrast  Result Date: 06/07/2019 CLINICAL DATA:  Loose stool, left lower quadrant abdominal pain EXAM: CT ABDOMEN AND PELVIS WITH CONTRAST TECHNIQUE: Multidetector CT imaging of the abdomen and pelvis was performed using the standard protocol following bolus administration of intravenous contrast. CONTRAST:  100mL OMNIPAQUE IOHEXOL 300 MG/ML  SOLN COMPARISON:  None. FINDINGS: Lower chest: No acute abnormality. Hepatobiliary: No solid liver abnormality is seen. No gallstones, gallbladder wall thickening, or biliary dilatation. Pancreas: Unremarkable. No pancreatic ductal dilatation or surrounding inflammatory changes. Spleen: Normal in size without significant abnormality. Adrenals/Urinary Tract: Adrenal glands are unremarkable. Kidneys  are normal, without renal calculi, solid lesion, or hydronephrosis. Bladder is unremarkable. Stomach/Bowel: Stomach is within normal limits. Appendix appears normal. No evidence of bowel wall thickening, distention, or inflammatory changes. Vascular/Lymphatic: No significant vascular findings are present. No enlarged abdominal or pelvic lymph nodes. Reproductive: No mass or other significant abnormality. Other: No abdominal wall hernia or abnormality. No abdominopelvic ascites. Musculoskeletal: No acute or significant osseous findings. IMPRESSION: No CT findings of the abdomen or pelvis to explain left lower quadrant abdominal pain. No significant diverticular disease. Electronically Signed   By: Lauralyn PrimesAlex  Bibbey M.D.   On: 06/07/2019 16:18   [place] Abdomen Limited Ruq  Result Date: 06/07/2019 CLINICAL DATA:  Elevated liver function tests. EXAM: ULTRASOUND ABDOMEN LIMITED RIGHT UPPER QUADRANT COMPARISON:  CT scan 06/07/2019  FINDINGS: Gallbladder: 0.3 cm polyp in the gallbladder is nonmobile on image 4/1. No overt gallbladder wall thickening. Sonographic Murphy's sign absent. No gallstones identified. Common bile duct: Diameter: 0.2 cm Liver: No focal lesion identified. Within normal limits in parenchymal echogenicity. Portal vein is patent on color Doppler imaging with normal direction of blood flow towards the liver. Other: None. IMPRESSION: 1. No sonographic abnormality to account for patient's elevated liver function tests. 2. 0.3 cm polyp in the gallbladder. Gallbladder polyps of this size are considered benign and do not require follow up. Electronically Signed   By: Gaylyn Rong M.D.   On: 06/07/2019 18:11    Procedures Procedures (including critical care time)  Medications Ordered in ED Medications  sodium chloride (PF) 0.9 % injection (has no administration in time range)  morphine 4 MG/ML injection 4 mg (4 mg Intravenous Given 06/07/19 1432)  sodium chloride 0.9 % bolus 1,000 mL (0 mLs  Intravenous Stopped 06/07/19 1620)  iohexol (OMNIPAQUE) 300 MG/ML solution 100 mL (100 mLs Intravenous Contrast Given 06/07/19 1544)     Initial Impression / Assessment and Plan / ED Course  I have reviewed the triage vital signs and the nursing notes.  Pertinent labs & imaging results that were available during my care of the patient were reviewed by me and considered in my medical decision making (see chart for details).  28 year old male presents with acute left lower abdominal pain. Vitals are normal. Abdomen is soft and tender in the LLQ. GU exam is normal. Will obtain labs, UA, CT abdomen pelvis. Will give fluids and morphine. DDx: gastritis/PUD, pancreatitis, gastroenteritis, kidney stone, appy, diverticulitis, constipation.  CBC is normal. CMP is remarkable for mild hypocalcemia (8.5), elevated AST (408) and ALT (155). AP and Bili are normal. Unclear etiology. Will add hepatitis panel and RUQ [place]. CT abdomen/pelvis is negative.  Rechecked pt - he reports pain is improved.  Right upper quadrant ultrasound is normal.  Discussed results with patient.  At this time his vital signs are reassuring and he is well-appearing.  I do not feel he needs admission at this point.  We will give him medicine for pain at home.  Advised to not drink any alcohol or take any Tylenol.  He is encouraged to follow-up with a PCP in the next 1 to 2 weeks to have his liver function rechecked. Return precautions discussed  Final Clinical Impressions(s) / ED Diagnoses   Final diagnoses:  Elevated LFTs  LLQ abdominal pain  Diarrhea, unspecified type    ED Discharge Orders    None       Bethel Born, PA-C 06/07/19 2015    Wynetta Fines, MD 06/07/19 2124

## 2019-06-07 NOTE — Discharge Instructions (Addendum)
Please follow up with a primary care provider in the next 1-2 weeks to have your liver function rechecked A Hepatitis panel has been sent off - you can review your results on MyChart in the next 2-3 days Rest and drink plenty of fluids Take medicine for pain as needed Do not drink alcohol or take any Tylenol Return if you are worsening (fever, vomiting, severe pain)

## 2019-06-07 NOTE — ED Notes (Signed)
Patient transported to CT 

## 2019-06-07 NOTE — ED Notes (Signed)
Pt given graham crackers, peanut butter, and ginger ale and able to keep that down without difficulty

## 2019-06-07 NOTE — ED Notes (Signed)
US at bedside

## 2019-06-08 LAB — HEPATITIS PANEL, ACUTE
HCV Ab: NONREACTIVE
Hep A IgM: NONREACTIVE
Hep B C IgM: NONREACTIVE
Hepatitis B Surface Ag: NONREACTIVE
